# Patient Record
Sex: Male | Born: 1970 | Race: Asian | Hispanic: No | Marital: Married | Smoking: Current every day smoker
Health system: Southern US, Community
[De-identification: ages and names within clinical notes are randomized; demographics above are authoritative.]

---

## 2016-12-13 ENCOUNTER — Ambulatory Visit: Payer: Self-pay | Admitting: Internal Medicine

## 2018-01-31 ENCOUNTER — Ambulatory Visit (INDEPENDENT_AMBULATORY_CARE_PROVIDER_SITE_OTHER): Payer: Self-pay

## 2018-01-31 ENCOUNTER — Encounter (HOSPITAL_COMMUNITY): Payer: Self-pay | Admitting: Emergency Medicine

## 2018-01-31 ENCOUNTER — Other Ambulatory Visit: Payer: Self-pay

## 2018-01-31 ENCOUNTER — Ambulatory Visit (HOSPITAL_COMMUNITY)
Admission: EM | Admit: 2018-01-31 | Discharge: 2018-01-31 | Disposition: A | Discharge status: Home / Self Care | Payer: Self-pay

## 2018-01-31 DIAGNOSIS — M545 Low back pain, unspecified: Secondary | ICD-10-CM

## 2018-01-31 DIAGNOSIS — M542 Cervicalgia: Secondary | ICD-10-CM

## 2018-01-31 DIAGNOSIS — M47812 Spondylosis without myelopathy or radiculopathy, cervical region: Secondary | ICD-10-CM

## 2018-01-31 DIAGNOSIS — R0789 Other chest pain: Secondary | ICD-10-CM

## 2018-01-31 DIAGNOSIS — M5030 Other cervical disc degeneration, unspecified cervical region: Secondary | ICD-10-CM

## 2018-01-31 DIAGNOSIS — M5136 Other intervertebral disc degeneration, lumbar region: Secondary | ICD-10-CM

## 2018-01-31 MED ORDER — CYCLOBENZAPRINE HCL 5 MG PO TABS: 5.0000 mg | ORAL_TABLET | Freq: Three times a day (TID) | ORAL | 0 refills | Status: AC | PRN

## 2018-01-31 MED ORDER — MELOXICAM 7.5 MG PO TABS: 7.5000 mg | ORAL_TABLET | Freq: Every day | ORAL | 1 refills | Status: AC
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
## 2020-03-05 ENCOUNTER — Ambulatory Visit: Payer: Self-pay | Attending: Internal Medicine

## 2020-03-05 DIAGNOSIS — Z23 Encounter for immunization: Secondary | ICD-10-CM

## 2020-03-05 NOTE — Progress Notes (Signed)
   Covid-19 Vaccination Clinic  Name:  Joseph Riggs    MRN: [id number] DOB: May 08, 1971  03/05/2020  [name] was observed post Covid-19 immunization for 15 minutes without incident. He was provided with Vaccine Information Sheet and instruction to access the V-Safe system.   [name] was instructed to call 911 with any severe reactions post vaccine: Marland Kitchen Difficulty breathing  . Swelling of face and throat  . A fast heartbeat  . A bad rash all over body  . Dizziness and weakness   Immunizations Administered    Name Date Dose VIS Date Route   Pfizer COVID-19 Vaccine 03/05/2020  8:55 AM 0.3 mL 11/15/2019 Intramuscular   Manufacturer: [employer], [employer]   Lot: [id number]   NDC: [address]-7

## 2020-03-30 ENCOUNTER — Ambulatory Visit: Payer: Self-pay | Attending: Internal Medicine

## 2020-03-30 DIAGNOSIS — Z23 Encounter for immunization: Secondary | ICD-10-CM

## 2020-03-30 NOTE — Progress Notes (Signed)
   Covid-19 Vaccination Clinic  Name:  Joseph Riggs    MRN: [id number] DOB: 11/03/1971  03/30/2020  Mr. Joseph Riggs was observed post Covid-19 immunization for 15 minutes without incident. He was provided with Vaccine Information Sheet and instruction to access the V-Safe system.   Mr. Joseph Riggs was instructed to call 911 with any severe reactions post vaccine: Marland Kitchen Difficulty breathing  . Swelling of face and throat  . A fast heartbeat  . A bad rash all over body  . Dizziness and weakness   Immunizations Administered    Name Date Dose VIS Date Route   Pfizer COVID-19 Vaccine 03/30/2020  8:50 AM 0.3 mL 01/29/2019 Intramuscular   Manufacturer: [employer], [employer]   Lot: [id number]   NDC: [address]-4

## 2020-06-24 ENCOUNTER — Encounter: Payer: Self-pay | Admitting: *Deleted

## 2020-06-24 NOTE — Congregational Nurse Program (Signed)
  Dept: [phone]   Congregational Nurse Program Note  Date of Encounter: 06/24/2020  Past Medical History: No past medical history on file.  Encounter Details:  CNP Questionnaire - 06/24/20 1747      Questionnaire   Patient Status Refugee    Race Asian    Location Patient Served At Not Applicable   [facility] Not Applicable    Uninsured Uninsured (Subsequent visits/quarter)    Food No food insecurities    Housing/Utilities Yes, have permanent housing    Transportation No transportation needs    Interpersonal Safety Yes, feel physically and emotionally safe where you currently live    Medication Yes, have medication insecurities    Medical Provider No    Referrals Not Applicable    ED Visit Averted Not Applicable    Life-Saving Intervention Made Not Applicable          Client came into clinic today for BP check.  BP 126/76 P 72.  He says he feels well today.  I encouraged healthy eating of more fruits and vegetables and continued exercise for BP control.  Encouraged him to return to clinic for BP check as desired.  Roderic Palau, RN, MSN, CNP [phone] Office [phone] Cell

## 2021-01-30 ENCOUNTER — Other Ambulatory Visit: Payer: Self-pay

## 2021-01-30 ENCOUNTER — Ambulatory Visit (INDEPENDENT_AMBULATORY_CARE_PROVIDER_SITE_OTHER): Payer: Self-pay

## 2021-01-30 DIAGNOSIS — Z23 Encounter for immunization: Secondary | ICD-10-CM

## 2021-01-30 NOTE — Progress Notes (Signed)
   Covid-19 Vaccination Clinic  Name:  Joseph Riggs    MRN: [id number] DOB: [birth year]  01/30/2021  Mr. [name] was observed post Covid-19 immunization for 15 minutes without incident. He was provided with Vaccine Information Sheet and instruction to access the V-Safe system.   Mr. [name] was instructed to call 911 with any severe reactions post vaccine: Marland Kitchen Difficulty breathing  . Swelling of face and throat  . A fast heartbeat  . A bad rash all over body  . Dizziness and weakness   Immunizations Administered    Name Date Dose VIS Date Route   PFIZER Comrnaty(Gray TOP) Covid-19 Vaccine 01/30/2021 10:13 AM 0.3 mL 11/12/2020 Intramuscular   Manufacturer: [employer], [employer]   Lot: [id number]   NDC: [phone]
